# Patient Record
Sex: Female | Born: 1955 | Hispanic: No | Smoking: Never smoker
Health system: Southern US, Community
[De-identification: ages and names within clinical notes are randomized; demographics above are authoritative.]

---

## 1999-04-07 ENCOUNTER — Other Ambulatory Visit: Admission: RE | Admit: 1999-04-07 | Discharge: 1999-04-07 | Payer: Self-pay | Admitting: Obstetrics & Gynecology

## 2000-04-24 ENCOUNTER — Other Ambulatory Visit: Admission: RE | Admit: 2000-04-24 | Discharge: 2000-04-24 | Payer: Self-pay | Admitting: Obstetrics & Gynecology

## 2001-07-25 ENCOUNTER — Other Ambulatory Visit: Admission: RE | Admit: 2001-07-25 | Discharge: 2001-07-25 | Payer: Self-pay | Admitting: Gynecology

## 2002-10-24 ENCOUNTER — Other Ambulatory Visit: Admission: RE | Admit: 2002-10-24 | Discharge: 2002-10-24 | Payer: Self-pay | Admitting: Obstetrics & Gynecology

## 2011-02-01 ENCOUNTER — Other Ambulatory Visit: Payer: Self-pay | Admitting: Obstetrics & Gynecology

## 2011-02-01 DIAGNOSIS — N63 Unspecified lump in unspecified breast: Secondary | ICD-10-CM

## 2011-02-07 ENCOUNTER — Ambulatory Visit
Admission: RE | Admit: 2011-02-07 | Discharge: 2011-02-07 | Disposition: A | Discharge status: Home / Self Care | Payer: 59 | Source: Ambulatory Visit | Attending: Obstetrics & Gynecology | Admitting: Obstetrics & Gynecology

## 2011-02-07 DIAGNOSIS — N63 Unspecified lump in unspecified breast: Secondary | ICD-10-CM

## 2011-02-10 ENCOUNTER — Other Ambulatory Visit: Payer: Self-pay | Admitting: Obstetrics & Gynecology

## 2011-02-10 ENCOUNTER — Ambulatory Visit
Admission: RE | Admit: 2011-02-10 | Discharge: 2011-02-10 | Disposition: A | Discharge status: Home / Self Care | Payer: 59 | Source: Ambulatory Visit | Attending: Obstetrics & Gynecology | Admitting: Obstetrics & Gynecology

## 2011-02-10 DIAGNOSIS — R928 Other abnormal and inconclusive findings on diagnostic imaging of breast: Secondary | ICD-10-CM

## 2012-04-20 ENCOUNTER — Other Ambulatory Visit: Payer: Self-pay | Admitting: Obstetrics & Gynecology

## 2012-04-20 DIAGNOSIS — R928 Other abnormal and inconclusive findings on diagnostic imaging of breast: Secondary | ICD-10-CM

## 2012-04-25 ENCOUNTER — Other Ambulatory Visit: Payer: Self-pay | Admitting: Obstetrics & Gynecology

## 2012-04-25 ENCOUNTER — Ambulatory Visit
Admission: RE | Admit: 2012-04-25 | Discharge: 2012-04-25 | Disposition: A | Discharge status: Home / Self Care | Payer: 59 | Source: Ambulatory Visit | Attending: Obstetrics & Gynecology | Admitting: Obstetrics & Gynecology

## 2012-04-25 DIAGNOSIS — R928 Other abnormal and inconclusive findings on diagnostic imaging of breast: Secondary | ICD-10-CM

## 2014-08-15 HISTORY — PX: BREAST EXCISIONAL BIOPSY: SUR124

## 2014-12-31 ENCOUNTER — Other Ambulatory Visit: Payer: Self-pay | Admitting: Family Medicine

## 2014-12-31 ENCOUNTER — Other Ambulatory Visit (HOSPITAL_COMMUNITY)
Admission: RE | Admit: 2014-12-31 | Discharge: 2014-12-31 | Disposition: A | Discharge status: Home / Self Care | Payer: 59 | Source: Ambulatory Visit | Attending: Family Medicine | Admitting: Family Medicine

## 2014-12-31 DIAGNOSIS — Z1151 Encounter for screening for human papillomavirus (HPV): Secondary | ICD-10-CM | POA: Insufficient documentation

## 2014-12-31 DIAGNOSIS — Z124 Encounter for screening for malignant neoplasm of cervix: Secondary | ICD-10-CM | POA: Insufficient documentation

## 2015-01-01 LAB — CYTOLOGY - PAP

## 2015-05-22 ENCOUNTER — Other Ambulatory Visit: Payer: Self-pay | Admitting: Obstetrics & Gynecology

## 2015-05-22 DIAGNOSIS — N63 Unspecified lump in unspecified breast: Secondary | ICD-10-CM

## 2015-06-05 ENCOUNTER — Other Ambulatory Visit: Payer: Self-pay | Admitting: Family Medicine

## 2015-06-05 ENCOUNTER — Ambulatory Visit
Admission: RE | Admit: 2015-06-05 | Discharge: 2015-06-05 | Disposition: A | Discharge status: Home / Self Care | Payer: 59 | Source: Ambulatory Visit | Attending: Family Medicine | Admitting: Family Medicine

## 2015-06-05 ENCOUNTER — Ambulatory Visit
Admission: RE | Admit: 2015-06-05 | Discharge: 2015-06-05 | Disposition: A | Discharge status: Home / Self Care | Payer: 59 | Source: Ambulatory Visit | Attending: Obstetrics & Gynecology | Admitting: Obstetrics & Gynecology

## 2015-06-05 DIAGNOSIS — N63 Unspecified lump in unspecified breast: Secondary | ICD-10-CM

## 2015-06-11 ENCOUNTER — Other Ambulatory Visit: Payer: Self-pay | Admitting: Family Medicine

## 2015-06-11 DIAGNOSIS — N63 Unspecified lump in unspecified breast: Secondary | ICD-10-CM

## 2015-06-12 ENCOUNTER — Ambulatory Visit
Admission: RE | Admit: 2015-06-12 | Discharge: 2015-06-12 | Disposition: A | Discharge status: Home / Self Care | Payer: 59 | Source: Ambulatory Visit | Attending: Family Medicine | Admitting: Family Medicine

## 2015-06-12 DIAGNOSIS — N63 Unspecified lump in unspecified breast: Secondary | ICD-10-CM

## 2015-06-19 ENCOUNTER — Ambulatory Visit: Payer: Self-pay | Admitting: Surgery

## 2015-06-19 DIAGNOSIS — N6310 Unspecified lump in the right breast, unspecified quadrant: Secondary | ICD-10-CM

## 2015-06-19 NOTE — H&P (Signed)
Marvene Staff 06/19/2015 10:50 AM Location: [facility] Patient #: [id number] DOB: [birth year] Widowed / Language: Melissa Callahan / Race: White Female  History of Present Illness Marcello Moores A. Cecille Mcclusky MD; 06/19/2015 11:16 AM) Patient words: right breast CSL Patient sent at the request of Dr. Jake Shark for abnormal mammogram. She underwent recent mammography and ultrasound which showed a density in her right breast. She has had multiple mammograms and follow-up due to dense breast tissue. The area in her right breast biopsied which was architectural distortion and found consistent with complex sclerosing lesion. Patient denies any history of breast pain, breast mass or nipple discharge.          CLINICAL DATA: Delayed followup for left breast nodule.  EXAM: DIGITAL DIAGNOSTIC BILATERAL MAMMOGRAM WITH 3D TOMOSYNTHESIS WITH CAD  ULTRASOUND BILATERAL BREAST  COMPARISON: 04/17/2012 and 01/18/2011  ACR Breast Density Category c: The breast tissue is heterogeneously dense, which may obscure small masses.  FINDINGS: In the left breast, the area of concern is a small circumscribed nodule in the posterior central portion of the breast. No sonographic correlate was identified for this abnormality and previous evaluations. This nodule appears stable or slightly smaller and consistent with benign process. In the lower central portion of the left breast there is a partially obscured oval mass further evaluated with ultrasound.  In the upper central portion of the right breast there is an area of distortion confirmed on spot compression views no further evaluated with ultrasound.  Mammographic images were processed with CAD.  On physical exam, I palpate no abnormality in the upper central portion of the right breast. I palpate no abnormality in the lower central portion of the left breast.  Targeted ultrasound is performed, showing a simple cyst in the 7 o'clock location of  the left breast 4 cm from the nipple which measures 0.6 x 0.9 x 0.4 cm. Evaluation of the upper central and lower central portions of the right breast show normal appearing fibroglandular tissue without mass or acoustic shadowing. Evaluation of the right axilla is negative for adenopathy.  IMPRESSION: 1. Stable circumscribed mass in the posterior central portion of the left breast. Given the long-term stability no further follow-up is necessary. 2. Simple cyst in the 7 o'clock location of the left breast for which no follow-up is necessary. 3. Area of distortion in the central portion of the right breast without sonographic correlate. 3D stereotactic guided core biopsy is recommended to exclude malignancy.  RECOMMENDATION: Stereotactic guided core biopsy right breast distortion. Biopsy is scheduled for the patient on Friday October 28 at 10 o'clock a.m. The patient will discontinue her daily 81 mg dose of aspirin 5 days prior to the biopsy.  I have discussed the findings and recommendations with the patient. Results were also provided in writing at the conclusion of the visit. If applicable, a reminder letter will be sent to the patient    Diagnosis Breast, right, needle core biopsy, upper, 12:00 o'clock COMPLEX SCLEROSING LESION    CLINICAL DATA: Post biopsy clip mammograms following stereotactic core needle biopsy of a right breast architectural distortion.  EXAM: DIAGNOSTIC RIGHT MAMMOGRAM POST STEREOTACTIC BIOPSY  COMPARISON: Previous exam(s).  FINDINGS: Mammographic images were obtained following stereotactic guided biopsy of a right breast architectural distortion. Coil shaped biopsy clip lies in the expected location of the architectural distortion.  IMPRESSION: Well-positioned coil shaped biopsy clip following stereotactic core needle biopsy of an architectural distortion in the right breast.  Final Assessment: Post Procedure Mammograms for Marker  Placement.  The patient is a 59 year old female.   Other Problems Ventura Sellers, [place]; 06/19/2015 10:50 AM) No pertinent past medical history  Past Surgical History Ventura Sellers, [place]; 06/19/2015 10:50 AM) Breast Biopsy Right. Cesarean Section - 1  Diagnostic Studies History Ventura Sellers, [place]; 06/19/2015 10:50 AM) Colonoscopy 1-5 years ago Mammogram within last year Pap Smear 1-5 years ago  Allergies Ventura Sellers, [place]; 06/19/2015 10:51 AM) Erythromycin *DERMATOLOGICALS*  Medication History Ventura Sellers, CMA; 06/19/2015 10:51 AM) Multi Vitamin Daily (Oral) Active. Aspirin (81MG  Tablet DR, Oral) Active. Medications Reconciled  Social History Ventura Sellers, [place]; 06/19/2015 10:50 AM) Alcohol use Occasional alcohol use. Caffeine use Carbonated beverages, Coffee, Tea. No drug use Tobacco use Never smoker.  Family History Ventura Sellers, [place]; 06/19/2015 10:50 AM) Breast Cancer Family Members In General. Cancer Mother. Diabetes Mellitus Father. Heart Disease Father. Hypertension Father.  Pregnancy / Birth History Ventura Sellers, [place]; 06/19/2015 10:50 AM) Age at menarche 42 years. Age of menopause 62-55 Gravida 2 Maternal age 30-30 Para 2     Review of Systems ([place]. Brooks CMA; 06/19/2015 10:50 AM) General Not Present- Appetite Loss, Chills, Fatigue, Fever, Night Sweats, Weight Gain and Weight Loss. Skin Not Present- Change in Wart/Mole, Dryness, Hives, Jaundice, New Lesions, Non-Healing Wounds, Rash and Ulcer. HEENT Not Present- Earache, Hearing Loss, Hoarseness, Nose Bleed, Oral Ulcers, Ringing in the Ears, Seasonal Allergies, Sinus Pain, Sore Throat, Visual Disturbances, Wears glasses/contact lenses and Yellow Eyes. Respiratory Not Present- Bloody sputum, Chronic Cough, Difficulty Breathing, Snoring and Wheezing. Cardiovascular Not Present- Chest Pain, Difficulty Breathing Lying Down, Leg Cramps,  Palpitations, Rapid Heart Rate, Shortness of Breath and Swelling of Extremities. Gastrointestinal Not Present- Abdominal Pain, Bloating, Bloody Stool, Change in Bowel Habits, Chronic diarrhea, Constipation, Difficulty Swallowing, Excessive gas, Gets full quickly at meals, Hemorrhoids, Indigestion, Nausea, Rectal Pain and Vomiting. Female Genitourinary Not Present- Frequency, Nocturia, Painful Urination, Pelvic Pain and Urgency. Psychiatric Not Present- Anxiety, Bipolar, Change in Sleep Pattern, Depression, Fearful and Frequent crying. Endocrine Not Present- Cold Intolerance, Excessive Hunger, Hair Changes, Heat Intolerance, Hot flashes and New Diabetes.  Vitals [employer] R. Brooks CMA; 06/19/2015 10:50 AM) 06/19/2015 10:50 AM Weight: 186.13 lb Height: 62in Body Surface Area: 1.85 m Body Mass Index: 34.04 kg/m  BP: 136/86 (Sitting, Left Arm, Standard)      Physical Exam (Clevie Prout A. Kentley Cedillo MD; 06/19/2015 11:16 AM)  General Mental Status-Alert. General Appearance-Consistent with stated age. Hydration-Well hydrated. Voice-Normal.  Head and Neck Head-normocephalic, atraumatic with no lesions or palpable masses. Trachea-midline. Thyroid Gland Characteristics - normal size and consistency.  Eye Eyeball - Bilateral-Extraocular movements intact. Sclera/Conjunctiva - Bilateral-No scleral icterus.  Chest and Lung Exam Chest and lung exam reveals -quiet, even and easy respiratory effort with no use of accessory muscles and on auscultation, normal breath sounds, no adventitious sounds and normal vocal resonance. Inspection Chest Wall - Normal. Back - normal.  Breast Breast - Left-Symmetric, Non Tender, No Biopsy scars, no Dimpling, No Inflammation, No Lumpectomy scars, No Mastectomy scars, No Peau d' Orange. Breast - Right-Symmetric, Non Tender, No Biopsy scars, no Dimpling, No Inflammation, No Lumpectomy scars, No Mastectomy scars, No Peau d' Orange. Breast  Lump-No Palpable Breast Mass.  Cardiovascular Cardiovascular examination reveals -normal heart sounds, regular rate and rhythm with no murmurs and normal pedal pulses bilaterally.  Abdomen Inspection Inspection of the abdomen reveals - No Hernias. Skin - Scar - no surgical scars. Palpation/Percussion Palpation and Percussion of the abdomen reveal -  Soft, Non Tender, No Rebound tenderness, No Rigidity (guarding) and No hepatosplenomegaly. Auscultation Auscultation of the abdomen reveals - Bowel sounds normal.  Neurologic Neurologic evaluation reveals -alert and oriented x 3 with no impairment of recent or remote memory. Mental Status-Normal.  Musculoskeletal Normal Exam - Left-Upper Extremity Strength Normal and Lower Extremity Strength Normal. Normal Exam - Right-Upper Extremity Strength Normal and Lower Extremity Strength Normal.  Lymphatic Head & Neck  General Head & Neck Lymphatics: Bilateral - Description - Normal. Axillary  General Axillary Region: Bilateral - Description - Normal. Tenderness - Non Tender. Femoral & Inguinal  Generalized Femoral & Inguinal Lymphatics: Bilateral - Description - Normal. Tenderness - Non Tender.    Assessment & Plan (Takeysha Bonk A. Lynesha Bango MD; 06/19/2015 11:13 AM)  SCLEROSING ADENOSIS OF BREAST, RIGHT (N60.21) Impression: recommend right breast seed localized lumpectomy Risk of lumpectomy include bleeding, infection, seroma, more surgery, use of seed/wire, wound care, cosmetic deformity and the need for other treatments, death , blood clots, death. Pt agrees to proceed.  Current Plans Pt Education - Patient education: Common breast problems (The Basics): discussed with patient and provided information. Pt Education - CCS Breast Biopsy HCI: discussed with patient and provided information. The anatomy and the physiology was discussed. The pathophysiology and natural history of the disease was discussed. Options were discussed and  recommendations were made. Technique, risks, benefits, & alternatives were discussed. Risks such as stroke, heart attack, bleeding, indection, death, and other risks discussed. Questions answered. The patient agrees to proce

## 2015-07-02 ENCOUNTER — Other Ambulatory Visit: Payer: Self-pay | Admitting: Surgery

## 2015-07-02 DIAGNOSIS — N6310 Unspecified lump in the right breast, unspecified quadrant: Secondary | ICD-10-CM

## 2015-07-16 ENCOUNTER — Encounter (HOSPITAL_BASED_OUTPATIENT_CLINIC_OR_DEPARTMENT_OTHER): Payer: Self-pay | Admitting: *Deleted

## 2015-07-20 ENCOUNTER — Encounter (HOSPITAL_BASED_OUTPATIENT_CLINIC_OR_DEPARTMENT_OTHER)
Admission: RE | Admit: 2015-07-20 | Discharge: 2015-07-20 | Disposition: A | Discharge status: Home / Self Care | Payer: 59 | Source: Ambulatory Visit | Attending: Surgery | Admitting: Surgery

## 2015-07-20 ENCOUNTER — Ambulatory Visit
Admission: RE | Admit: 2015-07-20 | Discharge: 2015-07-20 | Disposition: A | Discharge status: Home / Self Care | Payer: 59 | Source: Ambulatory Visit | Attending: Surgery | Admitting: Surgery

## 2015-07-20 DIAGNOSIS — Z803 Family history of malignant neoplasm of breast: Secondary | ICD-10-CM | POA: Diagnosis not present

## 2015-07-20 DIAGNOSIS — Z7982 Long term (current) use of aspirin: Secondary | ICD-10-CM | POA: Diagnosis not present

## 2015-07-20 DIAGNOSIS — L905 Scar conditions and fibrosis of skin: Secondary | ICD-10-CM | POA: Diagnosis not present

## 2015-07-20 DIAGNOSIS — D241 Benign neoplasm of right breast: Secondary | ICD-10-CM | POA: Diagnosis not present

## 2015-07-20 DIAGNOSIS — N6002 Solitary cyst of left breast: Secondary | ICD-10-CM | POA: Diagnosis not present

## 2015-07-20 DIAGNOSIS — Z6834 Body mass index (BMI) 34.0-34.9, adult: Secondary | ICD-10-CM | POA: Diagnosis not present

## 2015-07-20 DIAGNOSIS — Z79899 Other long term (current) drug therapy: Secondary | ICD-10-CM | POA: Diagnosis not present

## 2015-07-20 DIAGNOSIS — N6091 Unspecified benign mammary dysplasia of right breast: Secondary | ICD-10-CM | POA: Diagnosis not present

## 2015-07-20 DIAGNOSIS — E669 Obesity, unspecified: Secondary | ICD-10-CM | POA: Diagnosis not present

## 2015-07-20 DIAGNOSIS — R928 Other abnormal and inconclusive findings on diagnostic imaging of breast: Secondary | ICD-10-CM | POA: Diagnosis present

## 2015-07-20 DIAGNOSIS — N6310 Unspecified lump in the right breast, unspecified quadrant: Secondary | ICD-10-CM

## 2015-07-20 LAB — CBC WITH DIFFERENTIAL/PLATELET
Basophils Absolute: 0 10*3/uL (ref 0.0–0.1)
Basophils Relative: 0 %
EOS PCT: 2 %
Eosinophils Absolute: 0.1 10*3/uL (ref 0.0–0.7)
HCT: 43 % (ref 36.0–46.0)
Hemoglobin: 14.5 g/dL (ref 12.0–15.0)
LYMPHS ABS: 2.2 10*3/uL (ref 0.7–4.0)
LYMPHS PCT: 29 %
MCH: 29.4 pg (ref 26.0–34.0)
MCHC: 33.7 g/dL (ref 30.0–36.0)
MCV: 87.2 fL (ref 78.0–100.0)
MONO ABS: 0.6 10*3/uL (ref 0.1–1.0)
Monocytes Relative: 8 %
Neutro Abs: 4.5 10*3/uL (ref 1.7–7.7)
Neutrophils Relative %: 61 %
PLATELETS: 235 10*3/uL (ref 150–400)
RBC: 4.93 MIL/uL (ref 3.87–5.11)
RDW: 13.1 % (ref 11.5–15.5)
WBC: 7.4 10*3/uL (ref 4.0–10.5)

## 2015-07-20 LAB — COMPREHENSIVE METABOLIC PANEL
ALT: 38 U/L (ref 14–54)
AST: 29 U/L (ref 15–41)
Albumin: 3.8 g/dL (ref 3.5–5.0)
Alkaline Phosphatase: 107 U/L (ref 38–126)
Anion gap: 6 (ref 5–15)
BUN: 9 mg/dL (ref 6–20)
CHLORIDE: 106 mmol/L (ref 101–111)
CO2: 28 mmol/L (ref 22–32)
CREATININE: 0.71 mg/dL (ref 0.44–1.00)
Calcium: 9.4 mg/dL (ref 8.9–10.3)
Glucose, Bld: 105 mg/dL — ABNORMAL HIGH (ref 65–99)
POTASSIUM: 4.3 mmol/L (ref 3.5–5.1)
Sodium: 140 mmol/L (ref 135–145)
Total Bilirubin: 0.4 mg/dL (ref 0.3–1.2)
Total Protein: 6.8 g/dL (ref 6.5–8.1)

## 2015-07-23 ENCOUNTER — Ambulatory Visit (HOSPITAL_BASED_OUTPATIENT_CLINIC_OR_DEPARTMENT_OTHER): Payer: 59 | Admitting: Anesthesiology

## 2015-07-23 ENCOUNTER — Ambulatory Visit (HOSPITAL_BASED_OUTPATIENT_CLINIC_OR_DEPARTMENT_OTHER)
Admission: RE | Admit: 2015-07-23 | Discharge: 2015-07-23 | Disposition: A | Discharge status: Home / Self Care | Payer: 59 | Source: Ambulatory Visit | Attending: Surgery | Admitting: Surgery

## 2015-07-23 ENCOUNTER — Ambulatory Visit
Admission: RE | Admit: 2015-07-23 | Discharge: 2015-07-23 | Disposition: A | Discharge status: Home / Self Care | Payer: 59 | Source: Ambulatory Visit | Attending: Surgery | Admitting: Surgery

## 2015-07-23 ENCOUNTER — Encounter (HOSPITAL_BASED_OUTPATIENT_CLINIC_OR_DEPARTMENT_OTHER)
Admission: RE | Disposition: A | Discharge status: Home / Self Care | Payer: Self-pay | Source: Ambulatory Visit | Attending: Surgery

## 2015-07-23 ENCOUNTER — Encounter (HOSPITAL_BASED_OUTPATIENT_CLINIC_OR_DEPARTMENT_OTHER): Payer: Self-pay | Admitting: Anesthesiology

## 2015-07-23 DIAGNOSIS — Z6834 Body mass index (BMI) 34.0-34.9, adult: Secondary | ICD-10-CM | POA: Insufficient documentation

## 2015-07-23 DIAGNOSIS — Z79899 Other long term (current) drug therapy: Secondary | ICD-10-CM | POA: Insufficient documentation

## 2015-07-23 DIAGNOSIS — Z7982 Long term (current) use of aspirin: Secondary | ICD-10-CM | POA: Insufficient documentation

## 2015-07-23 DIAGNOSIS — N6002 Solitary cyst of left breast: Secondary | ICD-10-CM | POA: Insufficient documentation

## 2015-07-23 DIAGNOSIS — N6091 Unspecified benign mammary dysplasia of right breast: Secondary | ICD-10-CM | POA: Insufficient documentation

## 2015-07-23 DIAGNOSIS — L905 Scar conditions and fibrosis of skin: Secondary | ICD-10-CM | POA: Diagnosis not present

## 2015-07-23 DIAGNOSIS — N6310 Unspecified lump in the right breast, unspecified quadrant: Secondary | ICD-10-CM

## 2015-07-23 DIAGNOSIS — E669 Obesity, unspecified: Secondary | ICD-10-CM | POA: Insufficient documentation

## 2015-07-23 DIAGNOSIS — D241 Benign neoplasm of right breast: Secondary | ICD-10-CM | POA: Insufficient documentation

## 2015-07-23 DIAGNOSIS — Z803 Family history of malignant neoplasm of breast: Secondary | ICD-10-CM | POA: Insufficient documentation

## 2015-07-23 HISTORY — PX: BREAST LUMPECTOMY WITH RADIOACTIVE SEED LOCALIZATION: SHX6424

## 2015-07-23 SURGERY — BREAST LUMPECTOMY WITH RADIOACTIVE SEED LOCALIZATION
Anesthesia: General | Site: Breast | Laterality: Right

## 2015-07-23 MED ORDER — ONDANSETRON HCL 4 MG/2ML IJ SOLN: 4.0000 mg | Freq: Once | INTRAMUSCULAR | Status: DC | PRN

## 2015-07-23 MED ORDER — PROPOFOL 10 MG/ML IV BOLUS
INTRAVENOUS | Status: AC
  Filled 2015-07-23: qty 40

## 2015-07-23 MED ORDER — MIDAZOLAM HCL 5 MG/5ML IJ SOLN
INTRAMUSCULAR | Status: DC | PRN
  Administered 2015-07-23: 2 mg via INTRAVENOUS

## 2015-07-23 MED ORDER — CEFAZOLIN SODIUM-DEXTROSE 2-3 GM-% IV SOLR
INTRAVENOUS | Status: AC
  Filled 2015-07-23: qty 50

## 2015-07-23 MED ORDER — DEXAMETHASONE SODIUM PHOSPHATE 4 MG/ML IJ SOLN
INTRAMUSCULAR | Status: DC | PRN
  Administered 2015-07-23: 10 mg via INTRAVENOUS

## 2015-07-23 MED ORDER — PHENYLEPHRINE HCL 10 MG/ML IJ SOLN
INTRAMUSCULAR | Status: AC
  Filled 2015-07-23: qty 1

## 2015-07-23 MED ORDER — BUPIVACAINE-EPINEPHRINE (PF) 0.25% -1:200000 IJ SOLN
INTRAMUSCULAR | Status: AC
  Filled 2015-07-23: qty 30

## 2015-07-23 MED ORDER — SCOPOLAMINE 1 MG/3DAYS TD PT72: 1.0000 | MEDICATED_PATCH | Freq: Once | TRANSDERMAL | Status: DC | PRN

## 2015-07-23 MED ORDER — EPHEDRINE SULFATE 50 MG/ML IJ SOLN
INTRAMUSCULAR | Status: AC
  Filled 2015-07-23: qty 1

## 2015-07-23 MED ORDER — ONDANSETRON HCL 4 MG/2ML IJ SOLN
INTRAMUSCULAR | Status: DC | PRN
  Administered 2015-07-23: 4 mg via INTRAVENOUS

## 2015-07-23 MED ORDER — LIDOCAINE HCL (CARDIAC) 20 MG/ML IV SOLN
INTRAVENOUS | Status: DC | PRN
  Administered 2015-07-23: 50 mg via INTRAVENOUS

## 2015-07-23 MED ORDER — DEXAMETHASONE SODIUM PHOSPHATE 10 MG/ML IJ SOLN
INTRAMUSCULAR | Status: AC
  Filled 2015-07-23: qty 1

## 2015-07-23 MED ORDER — FENTANYL CITRATE (PF) 100 MCG/2ML IJ SOLN
INTRAMUSCULAR | Status: AC
  Filled 2015-07-23: qty 2

## 2015-07-23 MED ORDER — MIDAZOLAM HCL 2 MG/2ML IJ SOLN
INTRAMUSCULAR | Status: AC
  Filled 2015-07-23: qty 2

## 2015-07-23 MED ORDER — CEFAZOLIN SODIUM 10 G IJ SOLR
3.0000 g | INTRAMUSCULAR | Status: AC
  Administered 2015-07-23: 2 g via INTRAVENOUS

## 2015-07-23 MED ORDER — BUPIVACAINE-EPINEPHRINE (PF) 0.25% -1:200000 IJ SOLN
INTRAMUSCULAR | Status: DC | PRN
  Administered 2015-07-23: 10 mL

## 2015-07-23 MED ORDER — MIDAZOLAM HCL 2 MG/2ML IJ SOLN: 1.0000 mg | INTRAMUSCULAR | Status: DC | PRN

## 2015-07-23 MED ORDER — PROPOFOL 10 MG/ML IV BOLUS
INTRAVENOUS | Status: DC | PRN
  Administered 2015-07-23: 150 mg via INTRAVENOUS

## 2015-07-23 MED ORDER — FENTANYL CITRATE (PF) 100 MCG/2ML IJ SOLN
INTRAMUSCULAR | Status: DC | PRN
  Administered 2015-07-23: 100 ug via INTRAVENOUS

## 2015-07-23 MED ORDER — FENTANYL CITRATE (PF) 100 MCG/2ML IJ SOLN: 50.0000 ug | INTRAMUSCULAR | Status: DC | PRN

## 2015-07-23 MED ORDER — LIDOCAINE HCL (CARDIAC) 20 MG/ML IV SOLN
INTRAVENOUS | Status: AC
  Filled 2015-07-23: qty 5

## 2015-07-23 MED ORDER — CHLORHEXIDINE GLUCONATE 4 % EX LIQD: 1.0000 "application " | Freq: Once | CUTANEOUS | Status: DC

## 2015-07-23 MED ORDER — LACTATED RINGERS IV SOLN
INTRAVENOUS | Status: DC
  Administered 2015-07-23 (×2): via INTRAVENOUS

## 2015-07-23 MED ORDER — GLYCOPYRROLATE 0.2 MG/ML IJ SOLN: 0.2000 mg | Freq: Once | INTRAMUSCULAR | Status: DC | PRN

## 2015-07-23 MED ORDER — HYDROCODONE-ACETAMINOPHEN 5-325 MG PO TABS: 1.0000 | ORAL_TABLET | Freq: Four times a day (QID) | ORAL | Status: DC | PRN

## 2015-07-23 MED ORDER — FENTANYL CITRATE (PF) 100 MCG/2ML IJ SOLN: 25.0000 ug | INTRAMUSCULAR | Status: DC | PRN

## 2015-07-23 MED ORDER — ONDANSETRON HCL 4 MG/2ML IJ SOLN
INTRAMUSCULAR | Status: AC
  Filled 2015-07-23: qty 2

## 2015-07-23 SURGICAL SUPPLY — 50 items
APPLIER CLIP 9.375 MED OPEN (MISCELLANEOUS)
BINDER BREAST LRG (GAUZE/BANDAGES/DRESSINGS) IMPLANT
BINDER BREAST MEDIUM (GAUZE/BANDAGES/DRESSINGS) IMPLANT
BINDER BREAST XLRG (GAUZE/BANDAGES/DRESSINGS) ×2 IMPLANT
BINDER BREAST XXLRG (GAUZE/BANDAGES/DRESSINGS) IMPLANT
BLADE SURG 15 STRL LF DISP TIS (BLADE) ×1 IMPLANT
BLADE SURG 15 STRL SS (BLADE) ×1
CANISTER SUC SOCK COL 7IN (MISCELLANEOUS) IMPLANT
CANISTER SUCT 1200ML W/VALVE (MISCELLANEOUS) IMPLANT
CHLORAPREP W/TINT 26ML (MISCELLANEOUS) ×2 IMPLANT
CLIP APPLIE 9.375 MED OPEN (MISCELLANEOUS) IMPLANT
COVER BACK TABLE 60X90IN (DRAPES) ×2 IMPLANT
COVER MAYO STAND STRL (DRAPES) ×2 IMPLANT
COVER PROBE W GEL 5X96 (DRAPES) ×2 IMPLANT
DECANTER SPIKE VIAL GLASS SM (MISCELLANEOUS) IMPLANT
DEVICE DUBIN W/COMP PLATE 8390 (MISCELLANEOUS) ×2 IMPLANT
DRAPE LAPAROSCOPIC ABDOMINAL (DRAPES) IMPLANT
DRAPE LAPAROTOMY 100X72 PEDS (DRAPES) ×2 IMPLANT
DRAPE UTILITY XL STRL (DRAPES) ×2 IMPLANT
ELECT COATED BLADE 2.86 ST (ELECTRODE) ×2 IMPLANT
ELECT REM PT RETURN 9FT ADLT (ELECTROSURGICAL) ×2
ELECTRODE REM PT RTRN 9FT ADLT (ELECTROSURGICAL) ×1 IMPLANT
GLOVE BIO SURGEON STRL SZ 6.5 (GLOVE) ×4 IMPLANT
GLOVE BIOGEL PI IND STRL 7.0 (GLOVE) ×1 IMPLANT
GLOVE BIOGEL PI IND STRL 7.5 (GLOVE) ×1 IMPLANT
GLOVE BIOGEL PI IND STRL 8 (GLOVE) ×1 IMPLANT
GLOVE BIOGEL PI INDICATOR 7.0 (GLOVE) ×1
GLOVE BIOGEL PI INDICATOR 7.5 (GLOVE) ×1
GLOVE BIOGEL PI INDICATOR 8 (GLOVE) ×1
GLOVE ECLIPSE 8.0 STRL XLNG CF (GLOVE) ×2 IMPLANT
GOWN STRL REUS W/ TWL LRG LVL3 (GOWN DISPOSABLE) ×2 IMPLANT
GOWN STRL REUS W/TWL LRG LVL3 (GOWN DISPOSABLE) ×2
HEMOSTAT SNOW SURGICEL 2X4 (HEMOSTASIS) IMPLANT
ILLUMINATOR WAVEGUIDE N/F (MISCELLANEOUS) ×2 IMPLANT
KIT MARKER MARGIN INK (KITS) ×2 IMPLANT
LIQUID BAND (GAUZE/BANDAGES/DRESSINGS) ×2 IMPLANT
NEEDLE HYPO 25X1 1.5 SAFETY (NEEDLE) ×2 IMPLANT
NS IRRIG 1000ML POUR BTL (IV SOLUTION) IMPLANT
PACK BASIN DAY SURGERY FS (CUSTOM PROCEDURE TRAY) ×2 IMPLANT
PENCIL BUTTON HOLSTER BLD 10FT (ELECTRODE) ×2 IMPLANT
SLEEVE SCD COMPRESS KNEE MED (MISCELLANEOUS) ×2 IMPLANT
SPONGE LAP 4X18 X RAY DECT (DISPOSABLE) ×2 IMPLANT
SUT MNCRL AB 4-0 PS2 18 (SUTURE) ×2 IMPLANT
SUT SILK 2 0 SH (SUTURE) IMPLANT
SUT VICRYL 3-0 CR8 SH (SUTURE) ×2 IMPLANT
SYR CONTROL 10ML LL (SYRINGE) ×2 IMPLANT
TOWEL OR 17X24 6PK STRL BLUE (TOWEL DISPOSABLE) ×2 IMPLANT
TOWEL OR NON WOVEN STRL DISP B (DISPOSABLE) ×2 IMPLANT
TUBE CONNECTING 20X1/4 (TUBING) IMPLANT
YANKAUER SUCT BULB TIP NO VENT (SUCTIONS) IMPLANT

## 2015-07-23 NOTE — Anesthesia Preprocedure Evaluation (Signed)
Anesthesia Evaluation  Patient identified by MRN, date of birth, ID band Patient awake    Reviewed: Allergy & Precautions, NPO status , Patient's Chart, lab work & pertinent test results  History of Anesthesia Complications Negative for: history of anesthetic complications  Airway Mallampati: II  TM Distance: >3 FB Neck ROM: Full    Dental no notable dental hx. (+) Dental Advisory Given   Pulmonary neg pulmonary ROS,    Pulmonary exam normal breath sounds clear to auscultation       Cardiovascular negative cardio ROS Normal cardiovascular exam Rhythm:Regular Rate:Normal     Neuro/Psych negative neurological ROS  negative psych ROS   GI/Hepatic negative GI ROS, Neg liver ROS,   Endo/Other  obesity  Renal/GU negative Renal ROS  negative genitourinary   Musculoskeletal negative musculoskeletal ROS (+)   Abdominal   Peds negative pediatric ROS (+)  Hematology negative hematology ROS (+)   Anesthesia Other Findings   Reproductive/Obstetrics negative OB ROS                            Anesthesia Physical Anesthesia Plan  ASA: II  Anesthesia Plan: General   Post-op Pain Management:    Induction: Intravenous  Airway Management Planned: LMA  Additional Equipment:   Intra-op Plan:   Post-operative Plan: Extubation in OR  Informed Consent: I have reviewed the patients History and Physical, chart, labs and discussed the procedure including the risks, benefits and alternatives for the proposed anesthesia with the patient or authorized representative who has indicated his/her understanding and acceptance.   Dental advisory given  Plan Discussed with: CRNA  Anesthesia Plan Comments:         Anesthesia Quick Evaluation  

## 2015-07-23 NOTE — Op Note (Signed)
Preoperative diagnosis: Right breast sclerosing lesion  Postoperative diagnosis: Same  Procedure: Right breast seed localized lumpectomy  Surgeon: Erroll Luna M.D.  Anesthesia: LMA with 0.25% Sensorcaine local  EBL: Minimal  Specimen: Breast tissue with seed and clip in specimen verified by radiograph  Indications for procedure: The patient is a 59 year old female found to have a right breast mammographic abnormality during screening mammogram. Core biopsy  revealed a sclerosing lesion.We discussed options of excision versus observation. There is a small risk of malignancy associated with these findings are core biopsy. After discussion of all the rationale for doing biopsy versus observation she elected to have the area excised.The procedure has been discussed with the patient. Alternatives to surgery have been discussed with the patient.  Risks of surgery include bleeding,  Infection,  Seroma formation, death,  and the need for further surgery.   The patient understands and wishes to proceed.  Description of procedure: The patient was met in the holding area and questions were answered. Right breast was marked as the correct side and neoprobe used to verify seed location. She was taken back to the operating room and placed supine on the OR table. After induction of general anesthesia, right breast was prepped and draped in a sterile fashion. Timeout was done and she received appropriate preoperative antibiotics. Neoprobe was used identify the sites. This was 2 cm above the superior border of the nipple areolar complex. 0.25% Sensorcaine was infiltrated along the inferior border of the nipple areolar complex. Curvilinear incision was made along the border of the nipple areolar complex. Neoprobe was used to identify where the see was located no tissue around this was excised with grossly negative margin. Hemostasis was achieved. Radiograph revealed clip and seed to be in the specimen. This was  sent to pathology. Cavities hemostatic and closed with a deep layer of 0 Vicryl and 4-0 Monocryl subcutaneous  stitch. Liquid adhesive applied. Breast binder applied. Patient was extubated taken to recovery in satisfactory condition. All final counts found to be correct.

## 2015-07-23 NOTE — Anesthesia Postprocedure Evaluation (Signed)
Anesthesia Post Note  Patient: Melissa Callahan  Procedure(s) Performed: Procedure(s) (LRB): BREAST LUMPECTOMY WITH RADIOACTIVE SEED LOCALIZATION (Right)  Patient location during evaluation: PACU Anesthesia Type: General Level of consciousness: awake and alert Pain management: pain level controlled Vital Signs Assessment: post-procedure vital signs reviewed and stable Respiratory status: spontaneous breathing, nonlabored ventilation, respiratory function stable and patient connected to nasal cannula oxygen Cardiovascular status: blood pressure returned to baseline and stable Postop Assessment: no signs of nausea or vomiting Anesthetic complications: no    Last Vitals:  Filed Vitals:   07/23/15 1400 07/23/15 1452  BP: 136/87 146/88  Pulse: 85 91  Temp:    Resp: 13 16    Last Pain:  Filed Vitals:   07/23/15 1452  PainSc: 3                  [name]

## 2015-07-23 NOTE — Anesthesia Procedure Notes (Signed)
Procedure Name: LMA Insertion Date/Time: 07/23/2015 12:16 PM Performed by: Toula Moos L Pre-anesthesia Checklist: Patient identified, Emergency Drugs available, Suction available, Patient being monitored and Timeout performed Patient Re-evaluated:Patient Re-evaluated prior to inductionOxygen Delivery Method: Circle System Utilized Preoxygenation: Pre-oxygenation with 100% oxygen Intubation Type: IV induction Ventilation: Mask ventilation without difficulty LMA: LMA inserted LMA Size: 4.0 Number of attempts: 1 Airway Equipment and Method: Bite block Placement Confirmation: positive ETCO2 Tube secured with: Tape Dental Injury: Teeth and Oropharynx as per pre-operative assessment

## 2015-07-23 NOTE — Interval H&P Note (Signed)
History and Physical Interval Note:  07/23/2015 12:05 PM  Melissa Callahan  has presented today for surgery, with the diagnosis of RIGHT BREAST MASS, SCLEROSING LESION  The various methods of treatment have been discussed with the patient and family. After consideration of risks, benefits and other options for treatment, the patient has consented to  Procedure(s): BREAST LUMPECTOMY WITH RADIOACTIVE SEED LOCALIZATION (Right) as a surgical intervention .  The patient's history has been reviewed, patient examined, no change in status, stable for surgery.  I have reviewed the patient's chart and labs.  Questions were answered to the patient's satisfaction.     Melissa Callahan A.

## 2015-07-23 NOTE — Transfer of Care (Signed)
Immediate Anesthesia Transfer of Care Note  Patient: JALEYA HSU  Procedure(s) Performed: Procedure(s): BREAST LUMPECTOMY WITH RADIOACTIVE SEED LOCALIZATION (Right)  Patient Location: PACU  Anesthesia Type:General  Level of Consciousness: awake and patient cooperative  Airway & Oxygen Therapy: Patient Spontanous Breathing and Patient connected to face mask oxygen  Post-op Assessment: Report given to RN and Post -op Vital signs reviewed and stable  Post vital signs: Reviewed and stable  Last Vitals:  Filed Vitals:   07/23/15 1118  BP: 145/82  Pulse: 75  Temp: 36.6 C  Resp: 20    Complications: No apparent anesthesia complications

## 2015-07-23 NOTE — Discharge Instructions (Signed)
[facility] °Office Phone Number [phone] ° °BREAST BIOPSY/ PARTIAL MASTECTOMY: POST OP INSTRUCTIONS ° °Always review your discharge instruction sheet given to you by the facility where your surgery was performed. ° °IF YOU HAVE DISABILITY OR FAMILY LEAVE FORMS, YOU MUST BRING THEM TO THE OFFICE FOR PROCESSING.  DO NOT GIVE THEM TO YOUR DOCTOR. ° °1. A prescription for pain medication may be given to you upon discharge.  Take your pain medication as prescribed, if needed.  If narcotic pain medicine is not needed, then you may take acetaminophen (Tylenol) or ibuprofen (Advil) as needed. °2. Take your usually prescribed medications unless otherwise directed °3. If you need a refill on your pain medication, please contact your pharmacy.  They will contact our office to request authorization.  Prescriptions will not be filled after 5pm or on week-ends. °4. You should eat very light the first 24 hours after surgery, such as soup, crackers, pudding, etc.  Resume your normal diet the day after surgery. °5. Most patients will experience some swelling and bruising in the breast.  Ice packs and a good support bra will help.  Swelling and bruising can take several days to resolve.  °6. It is common to experience some constipation if taking pain medication after surgery.  Increasing fluid intake and taking a stool softener will usually help or prevent this problem from occurring.  A mild laxative (Milk of Magnesia or Miralax) should be taken according to package directions if there are no bowel movements after 48 hours. °7. Unless discharge instructions indicate otherwise, you may remove your bandages 24-48 hours after surgery, and you may shower at that time.  You may have steri-strips (small skin tapes) in place directly over the incision.  These strips should be left on the skin for 7-10 days.  If your surgeon used skin glue on the incision, you may shower in 24 hours.  The glue will flake off over the  next 2-3 weeks.  Any sutures or staples will be removed at the office during your follow-up visit. °8. ACTIVITIES:  You may resume regular daily activities (gradually increasing) beginning the next day.  Wearing a good support bra or sports bra minimizes pain and swelling.  You may have sexual intercourse when it is comfortable. °a. You may drive when you no longer are taking prescription pain medication, you can comfortably wear a seatbelt, and you can safely maneuver your car and apply brakes. °b. RETURN TO WORK:  ______________________________________________________________________________________ °9. You should see your doctor in the office for a follow-up appointment approximately two weeks after your surgery.  Your doctor’s nurse will typically make your follow-up appointment when she calls you with your pathology report.  Expect your pathology report 2-3 business days after your surgery.  You may call to check if you do not hear from us after three days. °10. OTHER INSTRUCTIONS: _______________________________________________________________________________________________ _____________________________________________________________________________________________________________________________________ °_____________________________________________________________________________________________________________________________________ °_____________________________________________________________________________________________________________________________________ ° °WHEN TO CALL YOUR DOCTOR: °1. Fever over 101.0 °2. Nausea and/or vomiting. °3. Extreme swelling or bruising. °4. Continued bleeding from incision. °5. Increased pain, redness, or drainage from the incision. ° °The clinic staff is available to answer your questions during regular business hours.  Please don’t hesitate to call and ask to speak to one of the nurses for clinical concerns.  If you have a medical emergency, go to the nearest  emergency room or call 911.  A surgeon from [facility] is always on call at the hospital. ° °For further questions, please visit [email or web address]  ° ° ° °  Post Anesthesia Home Care Instructions ° °Activity: °Get plenty of rest for the remainder of the day. A responsible adult should stay with you for 24 hours following the procedure.  °For the next 24 hours, DO NOT: °-Drive a car °-Operate machinery °-Drink alcoholic beverages °-Take any medication unless instructed by your physician °-Make any legal decisions or sign important papers. ° °Meals: °Start with liquid foods such as gelatin or soup. Progress to regular foods as tolerated. Avoid greasy, spicy, heavy foods. If nausea and/or vomiting occur, drink only clear liquids until the nausea and/or vomiting subsides. Call your physician if vomiting continues. ° °Special Instructions/Symptoms: °Your throat may feel dry or sore from the anesthesia or the breathing tube placed in your throat during surgery. If this causes discomfort, gargle with warm salt water. The discomfort should disappear within 24 hours. ° °If you had a scopolamine patch placed behind your ear for the management of post- operative nausea and/or vomiting: ° °1. The medication in the patch is effective for 72 hours, after which it should be removed.  Wrap patch in a tissue and discard in the trash. Wash hands thoroughly with soap and water. °2. You may remove the patch earlier than 72 hours if you experience unpleasant side effects which may include dry mouth, dizziness or visual disturbances. °3. Avoid touching the patch. Wash your hands with soap and water after contact with the patch. °  ° °

## 2015-07-23 NOTE — H&P (Signed)
H&P   Melissa Callahan (MR# [id number])      H&P Info    Author Note Status Last Update User Last Update Date/Time   Melissa Luna, MD Signed Melissa Luna, MD 06/19/2015 11:17 AM    H&P    Expand All Collapse All   Melissa Callahan 06/19/2015 10:50 AM Location: [facility] Patient #: [id number] DOB: Dec 13, 1955 Widowed / Language: Melissa Callahan / Race: White Female  History of Present Illness Melissa Callahan A. Melissa Ledbetter MD; 06/19/2015 11:16 AM) Patient words: right breast CSL Patient sent at the request of Dr. Jake Callahan for abnormal mammogram. She underwent recent mammography and ultrasound which showed a density in her right breast. She has had multiple mammograms and follow-up due to dense breast tissue. The area in her right breast biopsied which was architectural distortion and found consistent with complex sclerosing lesion. Patient denies any history of breast pain, breast mass or nipple discharge.          CLINICAL DATA: Delayed followup for left breast nodule.  EXAM: DIGITAL DIAGNOSTIC BILATERAL MAMMOGRAM WITH 3D TOMOSYNTHESIS WITH CAD  ULTRASOUND BILATERAL BREAST  COMPARISON: 04/17/2012 and 01/18/2011  ACR Breast Density Category c: The breast tissue is heterogeneously dense, which may obscure small masses.  FINDINGS: In the left breast, the area of concern is a small circumscribed nodule in the posterior central portion of the breast. No sonographic correlate was identified for this abnormality and previous evaluations. This nodule appears stable or slightly smaller and consistent with benign process. In the lower central portion of the left breast there is a partially obscured oval mass further evaluated with ultrasound.  In the upper central portion of the right breast there is an area of distortion confirmed on spot compression views no further evaluated with ultrasound.  Mammographic images were processed with CAD.  On physical exam, I palpate  no abnormality in the upper central portion of the right breast. I palpate no abnormality in the lower central portion of the left breast.  Targeted ultrasound is performed, showing a simple cyst in the 7 o'clock location of the left breast 4 cm from the nipple which measures 0.6 x 0.9 x 0.4 cm. Evaluation of the upper central and lower central portions of the right breast show normal appearing fibroglandular tissue without mass or acoustic shadowing. Evaluation of the right axilla is negative for adenopathy.  IMPRESSION: 1. Stable circumscribed mass in the posterior central portion of the left breast. Given the long-term stability no further follow-up is necessary. 2. Simple cyst in the 7 o'clock location of the left breast for which no follow-up is necessary. 3. Area of distortion in the central portion of the right breast without sonographic correlate. 3D stereotactic guided core biopsy is recommended to exclude malignancy.  RECOMMENDATION: Stereotactic guided core biopsy right breast distortion. Biopsy is scheduled for the patient on Friday October 28 at 10 o'clock a.m. The patient will discontinue her daily 81 mg dose of aspirin 5 days prior to the biopsy.  I have discussed the findings and recommendations with the patient. Results were also provided in writing at the conclusion of the visit. If applicable, a reminder letter will be sent to the patient    Diagnosis Breast, right, needle core biopsy, upper, 12:00 o'clock COMPLEX SCLEROSING LESION    CLINICAL DATA: Post biopsy clip mammograms following stereotactic core needle biopsy of a right breast architectural distortion.  EXAM: DIAGNOSTIC RIGHT MAMMOGRAM POST STEREOTACTIC BIOPSY  COMPARISON: Previous exam(s).  FINDINGS: Mammographic images were obtained  following stereotactic guided biopsy of a right breast architectural distortion. Coil shaped biopsy clip lies in the expected location of the  architectural distortion.  IMPRESSION: Well-positioned coil shaped biopsy clip following stereotactic core needle biopsy of an architectural distortion in the right breast.  Final Assessment: Post Procedure Mammograms for Marker Placement.  The patient is a 59 year old female.   Other Problems Melissa Callahan, [place]; 06/19/2015 10:50 AM) No pertinent past medical history  Past Surgical History Melissa Callahan, [place]; 06/19/2015 10:50 AM) Breast Biopsy Right. Cesarean Section - 1  Diagnostic Studies History Melissa Callahan, [place]; 06/19/2015 10:50 AM) Colonoscopy 1-5 years ago Mammogram within last year Pap Smear 1-5 years ago  Allergies Melissa Callahan, [place]; 06/19/2015 10:51 AM) Erythromycin *DERMATOLOGICALS*  Medication History Melissa Callahan, CMA; 06/19/2015 10:51 AM) Multi Vitamin Daily (Oral) Active. Aspirin (81MG  Tablet DR, Oral) Active. Medications Reconciled  Social History Melissa Callahan, [place]; 06/19/2015 10:50 AM) Alcohol use Occasional alcohol use. Caffeine use Carbonated beverages, Coffee, Tea. No drug use Tobacco use Never smoker.  Family History Melissa Callahan, [place]; 06/19/2015 10:50 AM) Breast Cancer Family Members In General. Cancer Mother. Diabetes Mellitus Father. Heart Disease Father. Hypertension Father.  Pregnancy / Birth History Melissa Callahan, [place]; 06/19/2015 10:50 AM) Age at menarche 39 years. Age of menopause 62-55 Gravida 2 Maternal age 4-30 Para 2     Review of Systems ([place]. Melissa Callahan CMA; 06/19/2015 10:50 AM) General Not Present- Appetite Loss, Chills, Fatigue, Fever, Night Sweats, Weight Gain and Weight Loss. Skin Not Present- Change in Wart/Mole, Dryness, Hives, Jaundice, New Lesions, Non-Healing Wounds, Rash and Ulcer. HEENT Not Present- Earache, Hearing Loss, Hoarseness, Nose Bleed, Oral Ulcers, Ringing in the Ears, Seasonal Allergies, Sinus Pain, Sore Throat, Visual Disturbances, Wears  glasses/contact lenses and Yellow Eyes. Respiratory Not Present- Bloody sputum, Chronic Cough, Difficulty Breathing, Snoring and Wheezing. Cardiovascular Not Present- Chest Pain, Difficulty Breathing Lying Down, Leg Cramps, Palpitations, Rapid Heart Rate, Shortness of Breath and Swelling of Extremities. Gastrointestinal Not Present- Abdominal Pain, Bloating, Bloody Stool, Change in Bowel Habits, Chronic diarrhea, Constipation, Difficulty Swallowing, Excessive gas, Gets full quickly at meals, Hemorrhoids, Indigestion, Nausea, Rectal Pain and Vomiting. Female Genitourinary Not Present- Frequency, Nocturia, Painful Urination, Pelvic Pain and Urgency. Psychiatric Not Present- Anxiety, Bipolar, Change in Sleep Pattern, Depression, Fearful and Frequent crying. Endocrine Not Present- Cold Intolerance, Excessive Hunger, Hair Changes, Heat Intolerance, Hot flashes and New Diabetes.  Vitals [employer] R. Melissa Callahan CMA; 06/19/2015 10:50 AM) 06/19/2015 10:50 AM Weight: 186.13 lb Height: 62in Body Surface Area: 1.85 m Body Mass Index: 34.04 kg/m  BP: 136/86 (Sitting, Left Arm, Standard)      Physical Exam (Macarius Ruark A. Raiden Yearwood MD; 06/19/2015 11:16 AM)  General Mental Status-Alert. General Appearance-Consistent with stated age. Hydration-Well hydrated. Voice-Normal.  Head and Neck Head-normocephalic, atraumatic with no lesions or palpable masses. Trachea-midline. Thyroid Gland Characteristics - normal size and consistency.  Eye Eyeball - Bilateral-Extraocular movements intact. Sclera/Conjunctiva - Bilateral-No scleral icterus.  Chest and Lung Exam Chest and lung exam reveals -quiet, even and easy respiratory effort with no use of accessory muscles and on auscultation, normal breath sounds, no adventitious sounds and normal vocal resonance. Inspection Chest Wall - Normal. Back - normal.  Breast Breast - Left-Symmetric, Non Tender, No Biopsy scars, no Dimpling, No  Inflammation, No Lumpectomy scars, No Mastectomy scars, No Peau d' Orange. Breast - Right-Symmetric, Non Tender, No Biopsy scars, no Dimpling, No Inflammation, No Lumpectomy scars, No Mastectomy scars, No Peau d'  Orange. Breast Lump-No Palpable Breast Mass.  Cardiovascular Cardiovascular examination reveals -normal heart sounds, regular rate and rhythm with no murmurs and normal pedal pulses bilaterally.  Abdomen Inspection Inspection of the abdomen reveals - No Hernias. Skin - Scar - no surgical scars. Palpation/Percussion Palpation and Percussion of the abdomen reveal - Soft, Non Tender, No Rebound tenderness, No Rigidity (guarding) and No hepatosplenomegaly. Auscultation Auscultation of the abdomen reveals - Bowel sounds normal.  Neurologic Neurologic evaluation reveals -alert and oriented x 3 with no impairment of recent or remote memory. Mental Status-Normal.  Musculoskeletal Normal Exam - Left-Upper Extremity Strength Normal and Lower Extremity Strength Normal. Normal Exam - Right-Upper Extremity Strength Normal and Lower Extremity Strength Normal.  Lymphatic Head & Neck  General Head & Neck Lymphatics: Bilateral - Description - Normal. Axillary  General Axillary Region: Bilateral - Description - Normal. Tenderness - Non Tender. Femoral & Inguinal  Generalized Femoral & Inguinal Lymphatics: Bilateral - Description - Normal. Tenderness - Non Tender.    Assessment & Plan (Delta Deshmukh A. Lam Mccubbins MD; 06/19/2015 11:13 AM)  SCLEROSING ADENOSIS OF BREAST, RIGHT (N60.21) Impression: recommend right breast seed localized lumpectomy Risk of lumpectomy include bleeding, infection, seroma, more surgery, use of seed/wire, wound care, cosmetic deformity and the need for other treatments, death , blood clots, death. Pt agrees to proceed.  Current Plans Pt Education - Patient education: Common breast problems (The Basics): discussed with patient and provided information. Pt  Education - CCS Breast Biopsy HCI: discussed with patient and provided information. The anatomy and the physiology was discussed. The pathophysiology and natural history of the disease was discussed. Options were discussed and recommendations were made. Technique, risks, benefits, & alternatives were discussed. Risks such as stroke, heart attack, bleeding, indection, death, and other risks discussed. Questions answered. The patient agrees to proce

## 2015-07-24 ENCOUNTER — Encounter (HOSPITAL_BASED_OUTPATIENT_CLINIC_OR_DEPARTMENT_OTHER): Payer: Self-pay | Admitting: Surgery

## 2015-12-18 ENCOUNTER — Emergency Department (HOSPITAL_COMMUNITY)
Admission: EM | Admit: 2015-12-18 | Discharge: 2015-12-18 | Disposition: A | Discharge status: Home / Self Care | Payer: 59 | Attending: Emergency Medicine | Admitting: Emergency Medicine

## 2015-12-18 ENCOUNTER — Emergency Department (HOSPITAL_COMMUNITY): Payer: 59

## 2015-12-18 ENCOUNTER — Encounter (HOSPITAL_COMMUNITY): Payer: Self-pay | Admitting: Family Medicine

## 2015-12-18 DIAGNOSIS — R200 Anesthesia of skin: Secondary | ICD-10-CM

## 2015-12-18 DIAGNOSIS — Z7982 Long term (current) use of aspirin: Secondary | ICD-10-CM | POA: Diagnosis not present

## 2015-12-18 DIAGNOSIS — Z79899 Other long term (current) drug therapy: Secondary | ICD-10-CM | POA: Diagnosis not present

## 2015-12-18 LAB — CBC
HCT: 45.1 % (ref 36.0–46.0)
Hemoglobin: 15 g/dL (ref 12.0–15.0)
MCH: 29.2 pg (ref 26.0–34.0)
MCHC: 33.3 g/dL (ref 30.0–36.0)
MCV: 87.7 fL (ref 78.0–100.0)
PLATELETS: 232 10*3/uL (ref 150–400)
RBC: 5.14 MIL/uL — ABNORMAL HIGH (ref 3.87–5.11)
RDW: 13.2 % (ref 11.5–15.5)
WBC: 7.4 10*3/uL (ref 4.0–10.5)

## 2015-12-18 LAB — BASIC METABOLIC PANEL
ANION GAP: 10 (ref 5–15)
BUN: 12 mg/dL (ref 6–20)
CALCIUM: 9.5 mg/dL (ref 8.9–10.3)
CO2: 24 mmol/L (ref 22–32)
CREATININE: 0.8 mg/dL (ref 0.44–1.00)
Chloride: 107 mmol/L (ref 101–111)
GFR calc Af Amer: >60 mL/min (ref >60)
GLUCOSE: 126 mg/dL — AB (ref 65–99)
Potassium: 4.4 mmol/L (ref 3.5–5.1)
Sodium: 141 mmol/L (ref 135–145)

## 2015-12-18 LAB — I-STAT TROPONIN, ED: TROPONIN I, POC: 0 ng/mL (ref 0.00–0.08)

## 2015-12-18 MED ORDER — GADOBENATE DIMEGLUMINE 529 MG/ML IV SOLN
17.0000 mL | Freq: Once | INTRAVENOUS | Status: AC | PRN
  Administered 2015-12-18: 20 mL via INTRAVENOUS

## 2015-12-18 MED ORDER — KETOROLAC TROMETHAMINE 60 MG/2ML IM SOLN: 60.0000 mg | Freq: Once | INTRAMUSCULAR | Status: DC

## 2015-12-18 NOTE — ED Provider Notes (Signed)
CSN: [id number]     Arrival date & time 12/18/15  0944 History   First MD Initiated Contact with Patient 12/18/15 1012     Chief Complaint  Patient presents with  . Numbness     (Consider location/radiation/quality/duration/timing/severity/associated sxs/prior Treatment) HPI   Melissa Callahan is a 60 y.o. female, patient with no pertinent past medical history, presenting to the ED with right arm numbness and tingling, intermittent over the last 5 days. Patient states she was sitting in church last Sunday when she had sudden onset of numbness, tingling, and discomfort in her right arm. The sensation subsided later that day. Patient states she was planning on bringing this up to her PCP since she already has an appointment at the end of this month. Today, the sensation occurred again upon waking and has not subsided. Patient also endorses numbness in the tips of all of her toes bilaterally. This has been going on for several weeks. Patient denies weakness, falls or trauma, fever/chills, nausea/vomiting, neck pain, shoulder pain, back pain, or any other complaints.    History reviewed. No pertinent past medical history. Past Surgical History  Procedure Laterality Date  . Cesarean section    . Breast lumpectomy with radioactive seed localization Right 07/23/2015    Procedure: BREAST LUMPECTOMY WITH RADIOACTIVE SEED LOCALIZATION;  Surgeon: Erroll Luna, MD;  Location: [facility];  Service: General;  Laterality: Right;   History reviewed. No pertinent family history. Social History  Substance Use Topics  . Smoking status: Never Smoker   . Smokeless tobacco: Never Used  . Alcohol Use: Yes     Comment: social   OB History    No data available     Review of Systems  Constitutional: Negative for fever and chills.  Gastrointestinal: Negative for nausea and vomiting.  Musculoskeletal: Negative for back pain, neck pain and neck stiffness.  Neurological: Positive for  numbness. Negative for dizziness, weakness, light-headedness and headaches.  All other systems reviewed and are negative.     Allergies  Erythromycin  Home Medications   Prior to Admission medications   Medication Sig Start Date End Date Taking? Authorizing Provider  aspirin 81 MG tablet Take 81 mg by mouth daily.   Yes Historical Provider, MD  Multiple Vitamins-Minerals (MULTIVITAMIN WITH MINERALS) tablet Take 1 tablet by mouth daily.   Yes Historical Provider, MD   BP 126/92 mmHg  Pulse 76  Temp(Src) 97.7 F (36.5 C) (Oral)  Resp 14  Ht 5\' 2"  (1.575 m)  Wt 82.555 kg  BMI 33.28 kg/m2  SpO2 100% Physical Exam  Constitutional: She is oriented to person, place, and time. She appears well-developed and well-nourished. No distress.  HENT:  Head: Normocephalic and atraumatic.  Eyes: Conjunctivae and EOM are normal. Pupils are equal, round, and reactive to light.  Neck: Normal range of motion. Neck supple.  Cardiovascular: Normal rate, regular rhythm, normal heart sounds and intact distal pulses.   Pulmonary/Chest: Effort normal and breath sounds normal. No respiratory distress.  Abdominal: Soft. There is no tenderness. There is no guarding.  Musculoskeletal: She exhibits no edema or tenderness.  Full ROM in all extremities and spine. No paraspinal tenderness.   Lymphadenopathy:    She has no cervical adenopathy.  Neurological: She is alert and oriented to person, place, and time. She has normal reflexes.  No sensory deficits in the right arm. Patient was tested with light touch, deep touch, and pain, with no abnormalities voiced by the patient. Pt voices decreased  sensation in the tips of all her toes, bilaterally. Strength 5/5 in all extremities. No gait disturbance. Coordination intact. Cranial nerves III-XII grossly intact. No facial droop.   Skin: Skin is warm and dry. She is not diaphoretic.  Psychiatric: She has a normal mood and affect. Her behavior is normal.  Nursing  note and vitals reviewed.   ED Course  Procedures (including critical care time) Labs Review Labs Reviewed  BASIC METABOLIC PANEL - Abnormal; Notable for the following:    Glucose, Bld 126 (*)    All other components within normal limits  CBC - Abnormal; Notable for the following:    RBC 5.14 (*)    All other components within normal limits  I-STAT TROPOININ, ED    Imaging Review Dg Chest 2 View  12/18/2015  CLINICAL DATA:  Right side face, arm, leg tingling this morning. Chest pain. EXAM: CHEST  2 VIEW COMPARISON:  None. FINDINGS: Heart and mediastinal contours are within normal limits. No focal opacities or effusions. No acute bony abnormality. IMPRESSION: No active cardiopulmonary disease. Electronically Signed   By: Rolm Baptise M.D.   On: 12/18/2015 10:50   I have personally reviewed and evaluated these images and lab results as part of my medical decision-making.   EKG Interpretation   Date/Time:  Friday Dec 18 2015 09:53:01 EDT Ventricular Rate:  112 PR Interval:  128 QRS Duration: 82 QT Interval:  346 QTC Calculation: 472 R Axis:   77 Text Interpretation:  Sinus tachycardia Otherwise normal ECG No acute  changes No old tracing to compare Confirmed by Kathrynn Humble, MD, Thelma Comp [phone])  on 12/18/2015 10:33:29 AM      MDM   Final diagnoses:  Arm numbness    Melissa Callahan presents with arm numbness, intermittent for the last five days.  Findings and plan of care discussed with Varney Biles, MD. Dr. Tammy Sours personally evaluated and examined this patient.  Patient's presentation is suspicious for a neurologic origin. MRI with and without contrast ordered to search for lesions and diseases such as MS. Upon reassessment, patient voices no change in her symptoms. Patient to receive outpatient neurology referral. 4:10 PM End of shift patient care handoff report given to [employer], PA-C. Plan: Review MRI results and, if there is nothing that needs immediate attention,  discharge patient with instructions to follow-up with neurology.    Filed Vitals:   12/18/15 0947 12/18/15 0953 12/18/15 1050  BP: 174/94  135/85  Pulse: 108  83  Temp: 97.7 F (36.5 C)    TempSrc: Oral    Resp: 18  18  Height:  5\' 2"  (1.575 m)   Weight:  82.555 kg   SpO2: 100%  97%   Filed Vitals:   12/18/15 0953 12/18/15 1050 12/18/15 1406 12/18/15 1514  BP:  135/85 145/86 126/92  Pulse:  83 76 76  Temp:      TempSrc:      Resp:  18 18 14   Height: 5\' 2"  (1.575 m)     Weight: 82.555 kg     SpO2:  97% 98% 100%     Lorayne Bender, PA-C 12/18/15 [place], MD 12/19/15 1208

## 2015-12-18 NOTE — Discharge Instructions (Signed)
You have been seen today for arm and toe numbness. You should follow-up with neurology. You may use the number provided to call to set up an appointment. Alternatively, you may contact a neurologist of your choice. The neurologist may want to order MRI and/or nerve conduction studies. Follow up with PCP as needed. Return to ED should symptoms worsen. May use ibuprofen or naproxen for discomfort.

## 2015-12-18 NOTE — ED Notes (Signed)
Pt here for right arm numbness and pain that started this am. sts that her arm hurts sitting still. sts also that she has been having some bilateral foot numbness.

## 2015-12-18 NOTE — ED Provider Notes (Signed)
3:36 PM Patient signed out to me by Caryl Asp, PA-C.  Patient presented earlier today for right arm numbness and tingling sensation.  She states that she awoke with this this morning.  She also states that she has had some bilateral feet numbness which has been intermittent for a week.  Patient seen by and discussed with Dr. Kathrynn Humble.  Plan:  Follow-up on MRI.  If negative discharge to home with neurology follow-up.  5:13 PM Patient reassessed, normal objective sensation and strength in all extremities.  MRI is negative for infarct. Will discharge to home, and recommend primary care and neurology follow-up. Patient understands and agrees the plan. Her other lab tests and chest x-ray are reassuring today. No further workup indicated emergently. Patient states that she is ready to go home.  Results for orders placed or performed during the hospital encounter of 0000000  Basic metabolic panel  Result Value Ref Range   Sodium 141 135 - 145 mmol/L   Potassium 4.4 3.5 - 5.1 mmol/L   Chloride 107 101 - 111 mmol/L   CO2 24 22 - 32 mmol/L   Glucose, Bld 126 (H) 65 - 99 mg/dL   BUN 12 6 - 20 mg/dL   Creatinine, Ser 0.80 0.44 - 1.00 mg/dL   Calcium 9.5 8.9 - 10.3 mg/dL   GFR calc non Af Amer >60 >60 mL/min   GFR calc Af Amer >60 >60 mL/min   Anion gap 10 5 - 15  CBC  Result Value Ref Range   WBC 7.4 4.0 - 10.5 K/uL   RBC 5.14 (H) 3.87 - 5.11 MIL/uL   Hemoglobin 15.0 12.0 - 15.0 g/dL   HCT 45.1 36.0 - 46.0 %   MCV 87.7 78.0 - 100.0 fL   MCH 29.2 26.0 - 34.0 pg   MCHC 33.3 30.0 - 36.0 g/dL   RDW 13.2 11.5 - 15.5 %   Platelets 232 150 - 400 K/uL  I-stat troponin, ED  Result Value Ref Range   Troponin i, poc 0.00 0.00 - 0.08 ng/mL   Comment 3           Dg Chest 2 View  12/18/2015  CLINICAL DATA:  Right side face, arm, leg tingling this morning. Chest pain. EXAM: CHEST  2 VIEW COMPARISON:  None. FINDINGS: Heart and mediastinal contours are within normal limits. No focal opacities or  effusions. No acute bony abnormality. IMPRESSION: No active cardiopulmonary disease. Electronically Signed   By: Rolm Baptise M.D.   On: 12/18/2015 10:50   Mr Jeri Cos [id number] Contrast  12/18/2015  CLINICAL DATA:  60 year old female with right arm numbness and pain started this morning. Bilateral foot numbness. Initial encounter. EXAM: MRI HEAD WITHOUT AND WITH CONTRAST TECHNIQUE: Multiplanar, multiecho pulse sequences of the brain and surrounding structures were obtained without and with intravenous contrast. CONTRAST:  17 cc MultiHance. COMPARISON:  None. FINDINGS: No acute infarct or intracranial hemorrhage. No intracranial mass or abnormal enhancement. No age advanced atrophy or hydrocephalus. Major intracranial vascular structures are patent. Cervical medullary junction, pituitary region, pineal region and orbital structures unremarkable. Mucosal thickening/ partial opacification sphenoid sinus air cells including aerated aspect of the upper left pterygoid plate. Minimal mucosal thickening ethmoid sinus air cells. Polypoid mucosal thickening inferior aspect left maxillary sinus with mild mucosal thickening inferior aspect right maxillary sinus. Partial opacification right frontal sinus. IMPRESSION: No acute infarct or intracranial hemorrhage. No intracranial mass or abnormal enhancement. Mucosal thickening/ partial opacification sphenoid sinus air cells including aerated aspect of the  upper left pterygoid plate. Minimal mucosal thickening ethmoid sinus air cells. Polypoid mucosal thickening inferior aspect left maxillary sinus with mild mucosal thickening inferior aspect right maxillary sinus. Partial opacification right frontal sinus. Electronically Signed   By: Genia Del M.D.   On: 12/18/2015 16:58      Montine Circle, PA-C 12/18/15 1714

## 2015-12-18 NOTE — ED Notes (Signed)
Patient stable and ambulatory. Patient verbalized understanding of the discharge instructions.   

## 2016-07-01 ENCOUNTER — Other Ambulatory Visit: Payer: Self-pay | Admitting: Surgery

## 2016-07-01 DIAGNOSIS — Z1231 Encounter for screening mammogram for malignant neoplasm of breast: Secondary | ICD-10-CM

## 2016-08-09 ENCOUNTER — Ambulatory Visit
Admission: RE | Admit: 2016-08-09 | Discharge: 2016-08-09 | Disposition: A | Discharge status: Home / Self Care | Payer: 59 | Source: Ambulatory Visit | Attending: Surgery | Admitting: Surgery

## 2016-08-09 DIAGNOSIS — Z1231 Encounter for screening mammogram for malignant neoplasm of breast: Secondary | ICD-10-CM

## 2016-09-15 DIAGNOSIS — D225 Melanocytic nevi of trunk: Secondary | ICD-10-CM | POA: Diagnosis not present

## 2016-09-15 DIAGNOSIS — D2262 Melanocytic nevi of left upper limb, including shoulder: Secondary | ICD-10-CM | POA: Diagnosis not present

## 2016-09-15 DIAGNOSIS — L820 Inflamed seborrheic keratosis: Secondary | ICD-10-CM | POA: Diagnosis not present

## 2016-09-15 DIAGNOSIS — D485 Neoplasm of uncertain behavior of skin: Secondary | ICD-10-CM | POA: Diagnosis not present

## 2016-11-17 DIAGNOSIS — D225 Melanocytic nevi of trunk: Secondary | ICD-10-CM | POA: Diagnosis not present

## 2016-11-17 DIAGNOSIS — D0359 Melanoma in situ of other part of trunk: Secondary | ICD-10-CM | POA: Diagnosis not present

## 2016-11-17 DIAGNOSIS — D485 Neoplasm of uncertain behavior of skin: Secondary | ICD-10-CM | POA: Diagnosis not present

## 2016-11-25 DIAGNOSIS — D0359 Melanoma in situ of other part of trunk: Secondary | ICD-10-CM | POA: Diagnosis not present

## 2016-11-25 DIAGNOSIS — D225 Melanocytic nevi of trunk: Secondary | ICD-10-CM | POA: Diagnosis not present

## 2017-02-23 DIAGNOSIS — D225 Melanocytic nevi of trunk: Secondary | ICD-10-CM | POA: Diagnosis not present

## 2017-02-23 DIAGNOSIS — Z1283 Encounter for screening for malignant neoplasm of skin: Secondary | ICD-10-CM | POA: Diagnosis not present

## 2017-02-23 DIAGNOSIS — D485 Neoplasm of uncertain behavior of skin: Secondary | ICD-10-CM | POA: Diagnosis not present

## 2017-04-26 DIAGNOSIS — Z0000 Encounter for general adult medical examination without abnormal findings: Secondary | ICD-10-CM | POA: Diagnosis not present

## 2017-05-22 ENCOUNTER — Other Ambulatory Visit: Payer: Self-pay | Admitting: Family Medicine

## 2017-05-22 DIAGNOSIS — Z1231 Encounter for screening mammogram for malignant neoplasm of breast: Secondary | ICD-10-CM

## 2017-05-25 DIAGNOSIS — D2272 Melanocytic nevi of left lower limb, including hip: Secondary | ICD-10-CM | POA: Diagnosis not present

## 2017-05-25 DIAGNOSIS — D225 Melanocytic nevi of trunk: Secondary | ICD-10-CM | POA: Diagnosis not present

## 2017-05-25 DIAGNOSIS — Z85820 Personal history of malignant melanoma of skin: Secondary | ICD-10-CM | POA: Diagnosis not present

## 2017-05-25 DIAGNOSIS — D485 Neoplasm of uncertain behavior of skin: Secondary | ICD-10-CM | POA: Diagnosis not present

## 2017-05-25 DIAGNOSIS — Z08 Encounter for follow-up examination after completed treatment for malignant neoplasm: Secondary | ICD-10-CM | POA: Diagnosis not present

## 2017-05-25 DIAGNOSIS — Z1283 Encounter for screening for malignant neoplasm of skin: Secondary | ICD-10-CM | POA: Diagnosis not present

## 2017-05-29 DIAGNOSIS — R030 Elevated blood-pressure reading, without diagnosis of hypertension: Secondary | ICD-10-CM | POA: Diagnosis not present

## 2017-06-16 DIAGNOSIS — R399 Unspecified symptoms and signs involving the genitourinary system: Secondary | ICD-10-CM | POA: Diagnosis not present

## 2017-08-10 ENCOUNTER — Ambulatory Visit
Admission: RE | Admit: 2017-08-10 | Discharge: 2017-08-10 | Disposition: A | Discharge status: Home / Self Care | Payer: 59 | Source: Ambulatory Visit | Attending: Family Medicine | Admitting: Family Medicine

## 2017-08-10 DIAGNOSIS — Z1231 Encounter for screening mammogram for malignant neoplasm of breast: Secondary | ICD-10-CM

## 2017-08-30 DIAGNOSIS — Z713 Dietary counseling and surveillance: Secondary | ICD-10-CM | POA: Diagnosis not present

## 2018-07-03 ENCOUNTER — Other Ambulatory Visit: Payer: Self-pay | Admitting: Family Medicine

## 2018-07-03 DIAGNOSIS — Z1231 Encounter for screening mammogram for malignant neoplasm of breast: Secondary | ICD-10-CM

## 2018-07-04 DIAGNOSIS — Z0000 Encounter for general adult medical examination without abnormal findings: Secondary | ICD-10-CM | POA: Diagnosis not present

## 2018-07-04 DIAGNOSIS — Z13220 Encounter for screening for lipoid disorders: Secondary | ICD-10-CM | POA: Diagnosis not present

## 2018-08-16 ENCOUNTER — Ambulatory Visit
Admission: RE | Admit: 2018-08-16 | Discharge: 2018-08-16 | Disposition: A | Discharge status: Home / Self Care | Payer: 59 | Source: Ambulatory Visit | Attending: Family Medicine | Admitting: Family Medicine

## 2018-08-16 DIAGNOSIS — Z1231 Encounter for screening mammogram for malignant neoplasm of breast: Secondary | ICD-10-CM

## 2018-08-22 DIAGNOSIS — L0292 Furuncle, unspecified: Secondary | ICD-10-CM | POA: Diagnosis not present

## 2018-09-10 DIAGNOSIS — B079 Viral wart, unspecified: Secondary | ICD-10-CM | POA: Diagnosis not present

## 2018-09-10 DIAGNOSIS — M79672 Pain in left foot: Secondary | ICD-10-CM | POA: Diagnosis not present

## 2018-09-10 DIAGNOSIS — M79671 Pain in right foot: Secondary | ICD-10-CM | POA: Diagnosis not present

## 2018-09-10 DIAGNOSIS — B351 Tinea unguium: Secondary | ICD-10-CM | POA: Diagnosis not present

## 2018-09-10 DIAGNOSIS — L600 Ingrowing nail: Secondary | ICD-10-CM | POA: Diagnosis not present

## 2018-10-01 DIAGNOSIS — B079 Viral wart, unspecified: Secondary | ICD-10-CM | POA: Diagnosis not present

## 2018-10-02 IMAGING — MG 2D DIGITAL SCREENING BILATERAL MAMMOGRAM WITH CAD AND ADJUNCT TO
8 of 12 series · 8 of 28 positions shown · non-contrast
Comparison: Previous exam(s).

CLINICAL DATA: Screening.

EXAM:
2D DIGITAL SCREENING BILATERAL MAMMOGRAM WITH CAD AND ADJUNCT TOMO

[L CC]
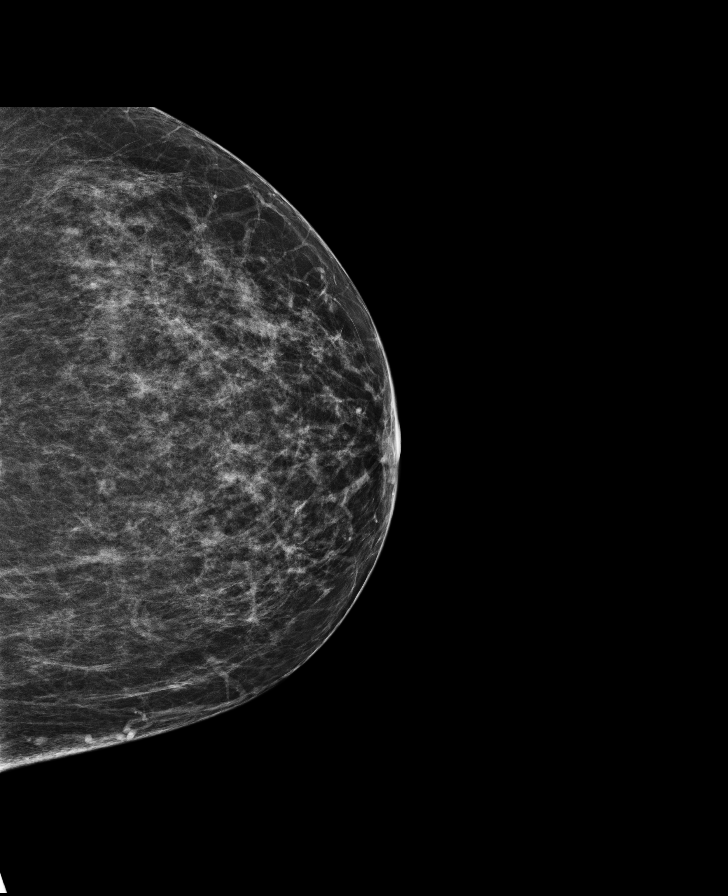

[L MLO synth-2D]
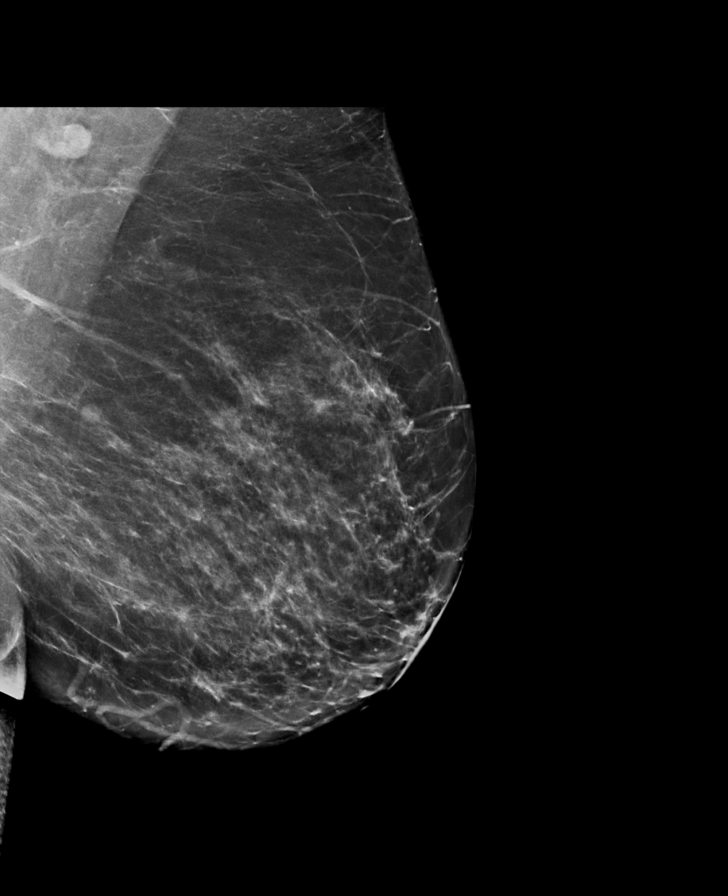

[L CC synth-2D]
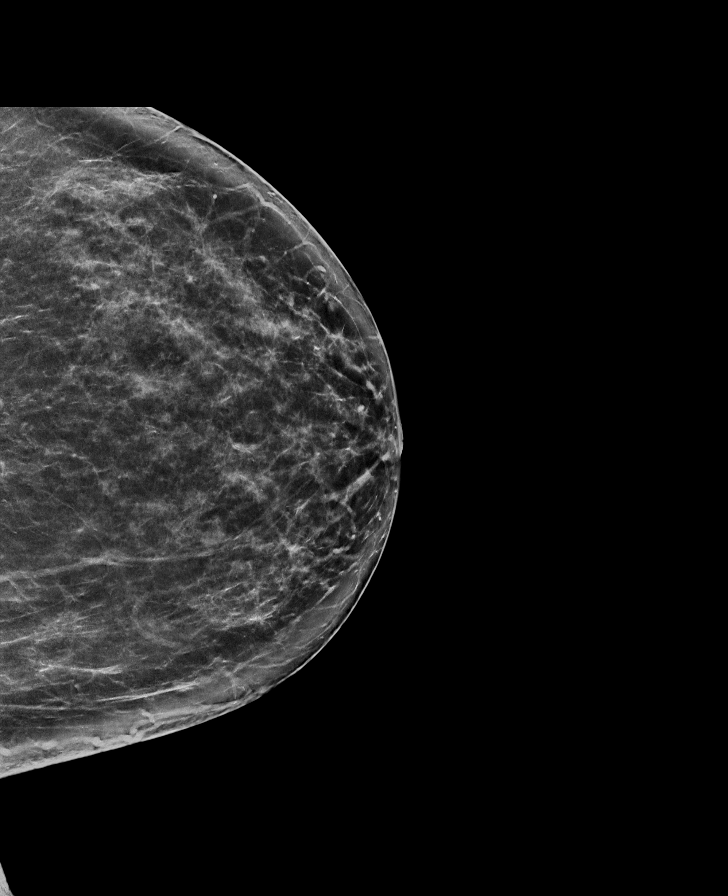

[R CC]
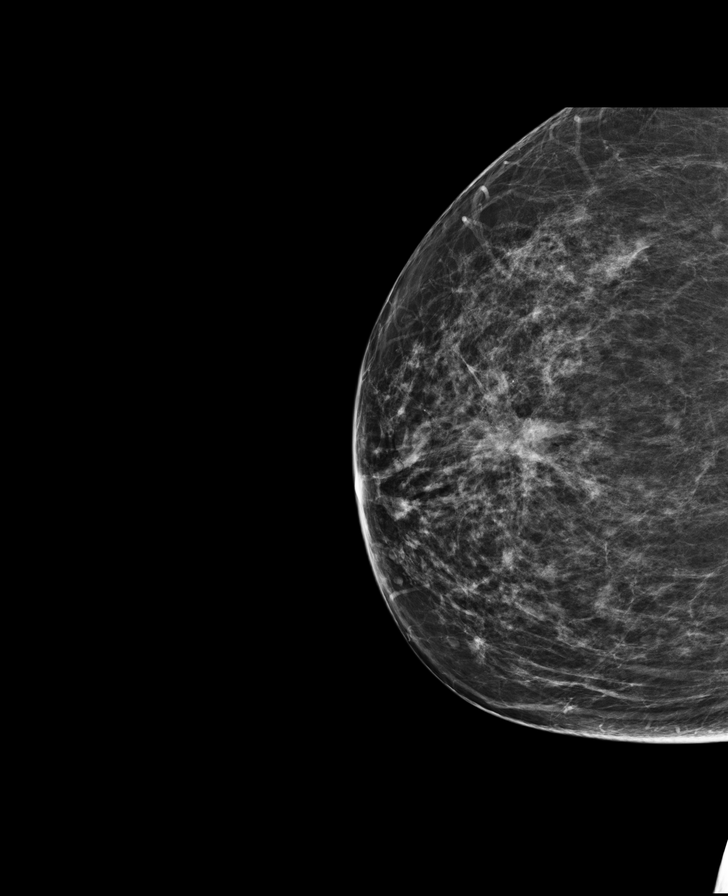

[R CC synth-2D]
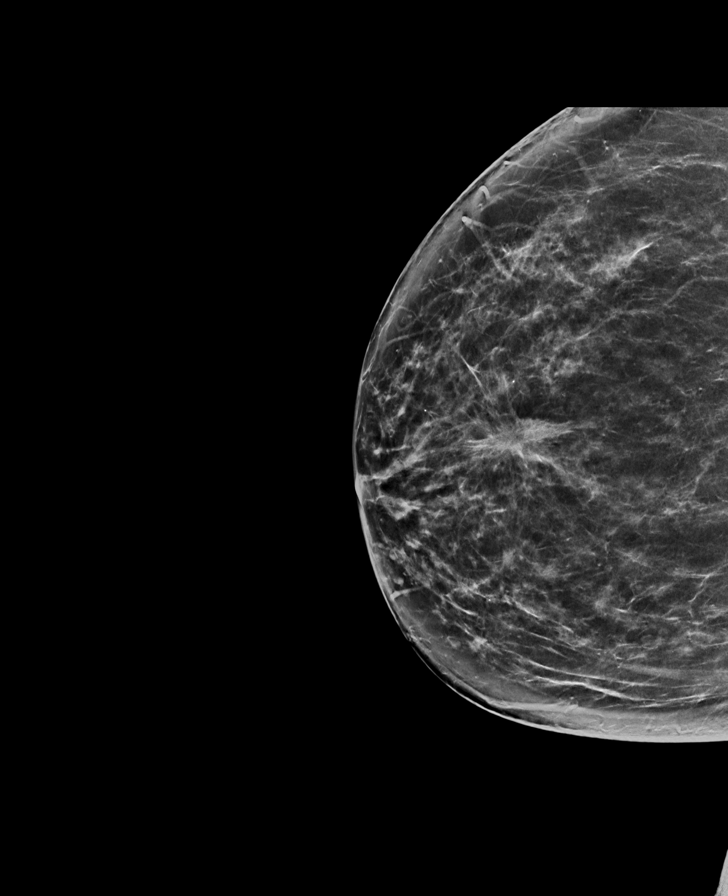

[R MLO synth-2D]
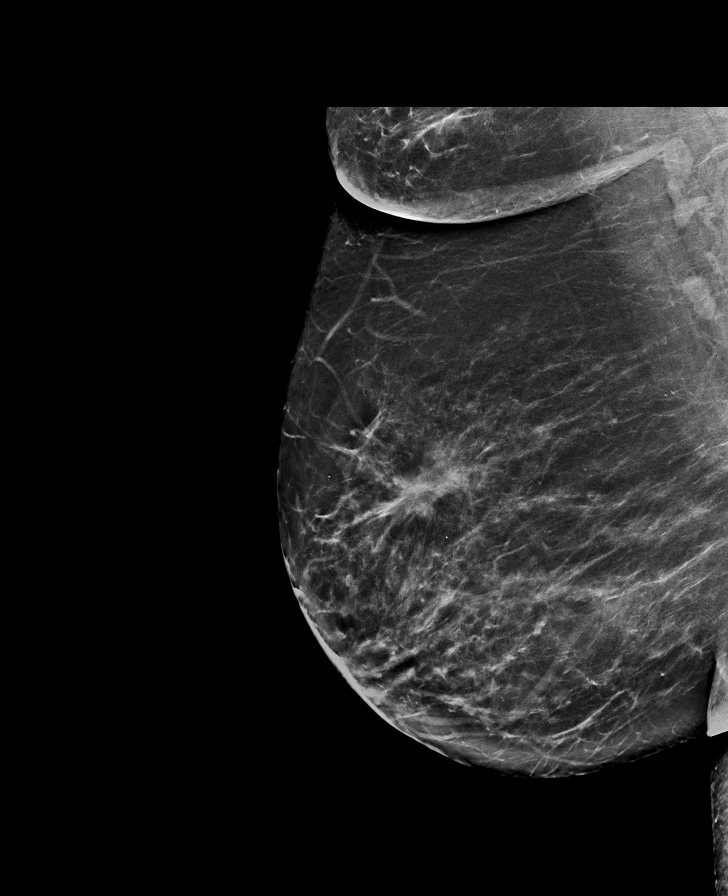

[R MLO]
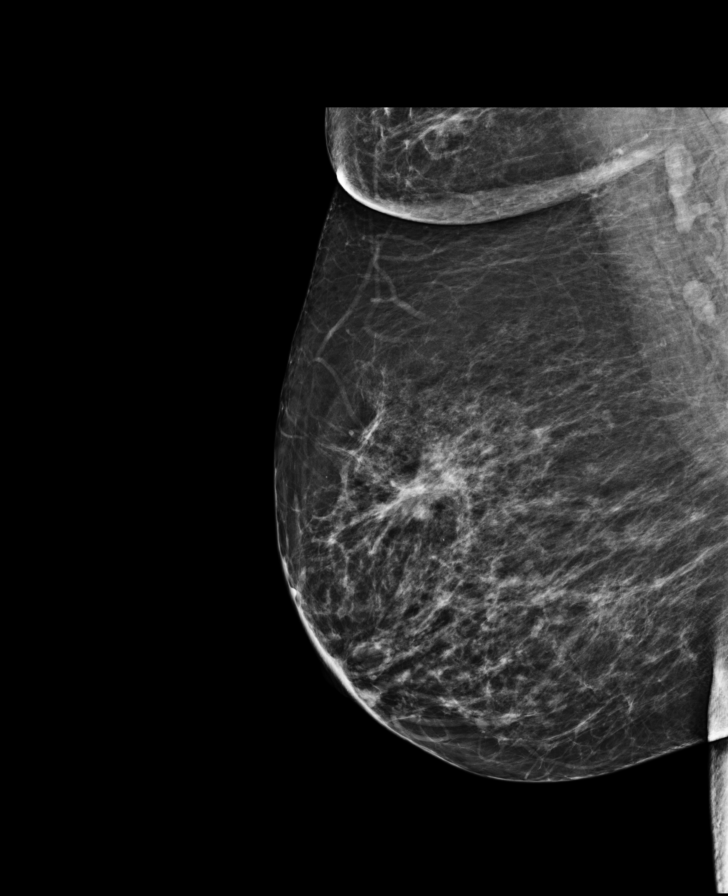

[L MLO]
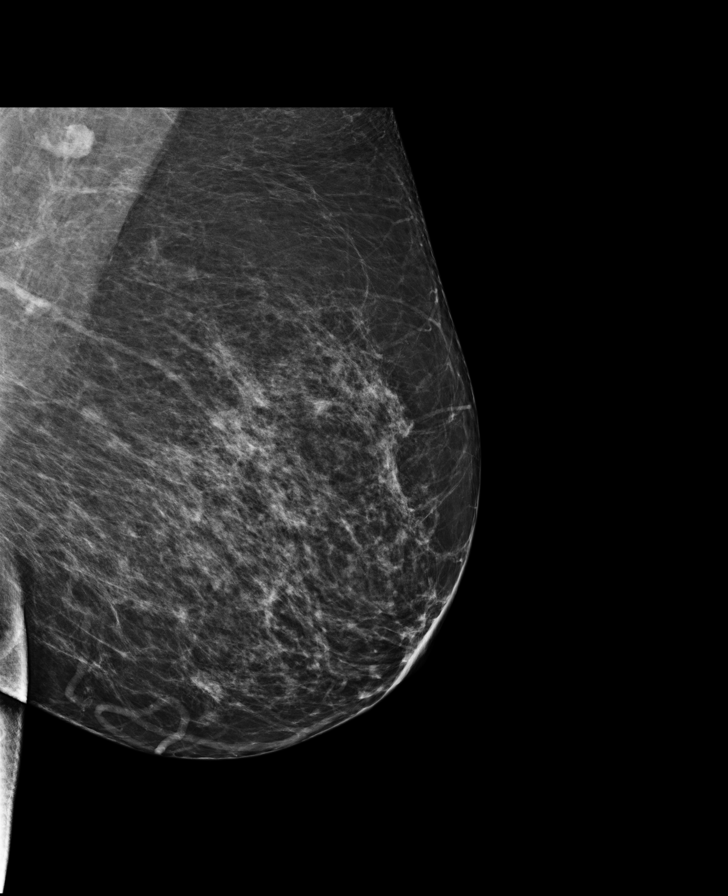

[8 of 28 positions shown; findings below may reference images not displayed]

ACR Breast Density Category c: The breast tissue is heterogeneously
dense, which may obscure small masses.
FINDINGS: There are no findings suspicious for malignancy. Images were
processed with CAD.
IMPRESSION: No mammographic evidence of malignancy. A result letter of this
screening mammogram will be mailed directly to the patient.

RECOMMENDATION:
Screening mammogram in one year. (Code:TN-0-K4T)

BI-RADS CATEGORY  1: Negative.

## 2018-10-08 DIAGNOSIS — H43811 Vitreous degeneration, right eye: Secondary | ICD-10-CM | POA: Diagnosis not present

## 2018-10-10 DIAGNOSIS — L600 Ingrowing nail: Secondary | ICD-10-CM | POA: Diagnosis not present

## 2018-10-10 DIAGNOSIS — B351 Tinea unguium: Secondary | ICD-10-CM | POA: Diagnosis not present

## 2018-10-10 DIAGNOSIS — M79674 Pain in right toe(s): Secondary | ICD-10-CM | POA: Diagnosis not present

## 2018-10-10 DIAGNOSIS — M79675 Pain in left toe(s): Secondary | ICD-10-CM | POA: Diagnosis not present

## 2018-11-08 DIAGNOSIS — M79675 Pain in left toe(s): Secondary | ICD-10-CM | POA: Diagnosis not present

## 2018-11-08 DIAGNOSIS — B351 Tinea unguium: Secondary | ICD-10-CM | POA: Diagnosis not present

## 2018-11-08 DIAGNOSIS — L600 Ingrowing nail: Secondary | ICD-10-CM | POA: Diagnosis not present

## 2018-11-08 DIAGNOSIS — M79674 Pain in right toe(s): Secondary | ICD-10-CM | POA: Diagnosis not present

## 2018-12-06 DIAGNOSIS — M79675 Pain in left toe(s): Secondary | ICD-10-CM | POA: Diagnosis not present

## 2018-12-06 DIAGNOSIS — M79674 Pain in right toe(s): Secondary | ICD-10-CM | POA: Diagnosis not present

## 2018-12-06 DIAGNOSIS — B351 Tinea unguium: Secondary | ICD-10-CM | POA: Diagnosis not present

## 2018-12-06 DIAGNOSIS — L600 Ingrowing nail: Secondary | ICD-10-CM | POA: Diagnosis not present
